# Patient Record
Sex: Female | Born: 1960 | Race: Black or African American | Hispanic: No | Marital: Single | Smoking: Never smoker
Health system: Southern US, Community
[De-identification: ages and names within clinical notes are randomized; demographics above are authoritative.]

## PROBLEM LIST (undated history)

## (undated) DIAGNOSIS — I10 Essential (primary) hypertension: Secondary | ICD-10-CM

## (undated) HISTORY — PX: BREAST BIOPSY: SHX20

---

## 2000-12-24 ENCOUNTER — Encounter: Payer: Self-pay | Admitting: Family Medicine

## 2000-12-24 ENCOUNTER — Encounter: Admission: RE | Admit: 2000-12-24 | Discharge: 2000-12-24 | Payer: Self-pay | Admitting: Family Medicine

## 2001-01-04 ENCOUNTER — Encounter: Payer: Self-pay | Admitting: Family Medicine

## 2001-01-04 ENCOUNTER — Ambulatory Visit (HOSPITAL_COMMUNITY): Admission: RE | Admit: 2001-01-04 | Discharge: 2001-01-04 | Payer: Self-pay | Admitting: Family Medicine

## 2001-12-16 ENCOUNTER — Ambulatory Visit (HOSPITAL_COMMUNITY): Admission: RE | Admit: 2001-12-16 | Discharge: 2001-12-16 | Payer: Self-pay | Admitting: Family Medicine

## 2001-12-16 ENCOUNTER — Encounter: Payer: Self-pay | Admitting: Family Medicine

## 2001-12-19 ENCOUNTER — Encounter: Admission: RE | Admit: 2001-12-19 | Discharge: 2002-02-14 | Payer: Self-pay | Admitting: Sports Medicine

## 2001-12-26 ENCOUNTER — Other Ambulatory Visit: Admission: RE | Admit: 2001-12-26 | Discharge: 2001-12-26 | Payer: Self-pay | Admitting: Family Medicine

## 2002-01-07 ENCOUNTER — Emergency Department (HOSPITAL_COMMUNITY): Admission: EM | Admit: 2002-01-07 | Discharge: 2002-01-07 | Payer: Self-pay | Admitting: Emergency Medicine

## 2002-01-14 ENCOUNTER — Ambulatory Visit (HOSPITAL_COMMUNITY): Admission: RE | Admit: 2002-01-14 | Discharge: 2002-01-14 | Payer: Self-pay | Admitting: Sports Medicine

## 2002-03-07 ENCOUNTER — Encounter: Admission: RE | Admit: 2002-03-07 | Discharge: 2002-04-13 | Payer: Self-pay | Admitting: Neurosurgery

## 2002-10-31 ENCOUNTER — Encounter: Payer: Self-pay | Admitting: Family Medicine

## 2002-10-31 ENCOUNTER — Encounter: Admission: RE | Admit: 2002-10-31 | Discharge: 2002-10-31 | Payer: Self-pay | Admitting: Family Medicine

## 2003-07-19 ENCOUNTER — Other Ambulatory Visit: Admission: RE | Admit: 2003-07-19 | Discharge: 2003-07-19 | Payer: Self-pay | Admitting: Family Medicine

## 2003-08-03 ENCOUNTER — Ambulatory Visit (HOSPITAL_COMMUNITY): Admission: RE | Admit: 2003-08-03 | Discharge: 2003-08-03 | Payer: Self-pay | Admitting: Family Medicine

## 2003-10-25 ENCOUNTER — Encounter: Admission: RE | Admit: 2003-10-25 | Discharge: 2003-10-25 | Payer: Self-pay | Admitting: Family Medicine

## 2003-12-13 ENCOUNTER — Encounter: Admission: RE | Admit: 2003-12-13 | Discharge: 2003-12-13 | Payer: Self-pay | Admitting: Family Medicine

## 2004-07-21 ENCOUNTER — Other Ambulatory Visit: Admission: RE | Admit: 2004-07-21 | Discharge: 2004-07-21 | Payer: Self-pay | Admitting: Family Medicine

## 2004-07-24 ENCOUNTER — Ambulatory Visit (HOSPITAL_COMMUNITY): Admission: RE | Admit: 2004-07-24 | Discharge: 2004-07-24 | Payer: Self-pay | Admitting: Family Medicine

## 2004-11-18 ENCOUNTER — Encounter: Admission: RE | Admit: 2004-11-18 | Discharge: 2004-11-18 | Payer: Self-pay | Admitting: Family Medicine

## 2005-08-31 ENCOUNTER — Other Ambulatory Visit: Admission: RE | Admit: 2005-08-31 | Discharge: 2005-08-31 | Payer: Self-pay | Admitting: Family Medicine

## 2005-11-26 ENCOUNTER — Encounter: Admission: RE | Admit: 2005-11-26 | Discharge: 2005-11-26 | Payer: Self-pay | Admitting: Family Medicine

## 2006-03-29 ENCOUNTER — Encounter: Admission: RE | Admit: 2006-03-29 | Discharge: 2006-03-29 | Payer: Self-pay | Admitting: Family Medicine

## 2006-09-08 ENCOUNTER — Other Ambulatory Visit: Admission: RE | Admit: 2006-09-08 | Discharge: 2006-09-08 | Payer: Self-pay | Admitting: Family Medicine

## 2006-12-02 ENCOUNTER — Encounter: Admission: RE | Admit: 2006-12-02 | Discharge: 2006-12-02 | Payer: Self-pay | Admitting: Family Medicine

## 2007-12-15 ENCOUNTER — Encounter: Admission: RE | Admit: 2007-12-15 | Discharge: 2007-12-15 | Payer: Self-pay | Admitting: Family Medicine

## 2007-12-22 ENCOUNTER — Other Ambulatory Visit: Admission: RE | Admit: 2007-12-22 | Discharge: 2007-12-22 | Payer: Self-pay | Admitting: Obstetrics and Gynecology

## 2008-06-13 ENCOUNTER — Other Ambulatory Visit: Admission: RE | Admit: 2008-06-13 | Discharge: 2008-06-13 | Payer: Self-pay | Admitting: Otolaryngology

## 2008-12-20 ENCOUNTER — Encounter: Admission: RE | Admit: 2008-12-20 | Discharge: 2008-12-20 | Payer: Self-pay | Admitting: Family Medicine

## 2009-07-16 ENCOUNTER — Other Ambulatory Visit: Admission: RE | Admit: 2009-07-16 | Discharge: 2009-07-16 | Payer: Self-pay | Admitting: Obstetrics and Gynecology

## 2010-01-03 ENCOUNTER — Encounter: Admission: RE | Admit: 2010-01-03 | Discharge: 2010-01-03 | Payer: Self-pay | Admitting: Family Medicine

## 2010-12-08 ENCOUNTER — Other Ambulatory Visit: Payer: Self-pay | Admitting: Family Medicine

## 2010-12-09 ENCOUNTER — Other Ambulatory Visit: Payer: Self-pay | Admitting: Family Medicine

## 2010-12-09 DIAGNOSIS — Z1231 Encounter for screening mammogram for malignant neoplasm of breast: Secondary | ICD-10-CM

## 2011-01-09 ENCOUNTER — Ambulatory Visit
Admission: RE | Admit: 2011-01-09 | Discharge: 2011-01-09 | Disposition: A | Discharge status: Home / Self Care | Payer: 59 | Source: Ambulatory Visit | Attending: Family Medicine | Admitting: Family Medicine

## 2011-01-09 DIAGNOSIS — Z1231 Encounter for screening mammogram for malignant neoplasm of breast: Secondary | ICD-10-CM

## 2011-08-25 ENCOUNTER — Other Ambulatory Visit: Payer: Self-pay | Admitting: Obstetrics and Gynecology

## 2011-08-25 ENCOUNTER — Other Ambulatory Visit (HOSPITAL_COMMUNITY)
Admission: RE | Admit: 2011-08-25 | Discharge: 2011-08-25 | Disposition: A | Discharge status: Home / Self Care | Payer: 59 | Source: Ambulatory Visit | Attending: Obstetrics and Gynecology | Admitting: Obstetrics and Gynecology

## 2011-08-25 DIAGNOSIS — Z01419 Encounter for gynecological examination (general) (routine) without abnormal findings: Secondary | ICD-10-CM | POA: Insufficient documentation

## 2011-12-14 ENCOUNTER — Other Ambulatory Visit: Payer: Self-pay | Admitting: Family Medicine

## 2011-12-14 DIAGNOSIS — Z1231 Encounter for screening mammogram for malignant neoplasm of breast: Secondary | ICD-10-CM

## 2012-01-22 ENCOUNTER — Ambulatory Visit
Admission: RE | Admit: 2012-01-22 | Discharge: 2012-01-22 | Disposition: A | Discharge status: Home / Self Care | Payer: 59 | Source: Ambulatory Visit | Attending: Family Medicine | Admitting: Family Medicine

## 2012-01-22 DIAGNOSIS — Z1231 Encounter for screening mammogram for malignant neoplasm of breast: Secondary | ICD-10-CM

## 2012-03-11 ENCOUNTER — Ambulatory Visit
Admission: RE | Admit: 2012-03-11 | Discharge: 2012-03-11 | Disposition: A | Discharge status: Home / Self Care | Payer: 59 | Source: Ambulatory Visit | Attending: Family Medicine | Admitting: Family Medicine

## 2012-03-11 ENCOUNTER — Other Ambulatory Visit: Payer: Self-pay | Admitting: Family Medicine

## 2012-03-11 DIAGNOSIS — M25559 Pain in unspecified hip: Secondary | ICD-10-CM

## 2012-03-14 ENCOUNTER — Other Ambulatory Visit: Payer: Self-pay | Admitting: Family Medicine

## 2012-03-14 DIAGNOSIS — R936 Abnormal findings on diagnostic imaging of limbs: Secondary | ICD-10-CM

## 2012-03-18 ENCOUNTER — Other Ambulatory Visit: Payer: 59

## 2012-04-13 ENCOUNTER — Ambulatory Visit
Admission: RE | Admit: 2012-04-13 | Discharge: 2012-04-13 | Disposition: A | Discharge status: Home / Self Care | Payer: 59 | Source: Ambulatory Visit | Attending: Family Medicine | Admitting: Family Medicine

## 2012-04-13 DIAGNOSIS — R936 Abnormal findings on diagnostic imaging of limbs: Secondary | ICD-10-CM

## 2012-08-31 ENCOUNTER — Other Ambulatory Visit (HOSPITAL_COMMUNITY)
Admission: RE | Admit: 2012-08-31 | Discharge: 2012-08-31 | Disposition: A | Discharge status: Home / Self Care | Payer: 59 | Source: Ambulatory Visit | Attending: Obstetrics and Gynecology | Admitting: Obstetrics and Gynecology

## 2012-08-31 ENCOUNTER — Other Ambulatory Visit: Payer: Self-pay | Admitting: Obstetrics and Gynecology

## 2012-08-31 DIAGNOSIS — Z1151 Encounter for screening for human papillomavirus (HPV): Secondary | ICD-10-CM | POA: Insufficient documentation

## 2012-08-31 DIAGNOSIS — Z01419 Encounter for gynecological examination (general) (routine) without abnormal findings: Secondary | ICD-10-CM | POA: Insufficient documentation

## 2012-12-21 ENCOUNTER — Other Ambulatory Visit: Payer: Self-pay

## 2012-12-21 DIAGNOSIS — Z1231 Encounter for screening mammogram for malignant neoplasm of breast: Secondary | ICD-10-CM

## 2013-01-23 ENCOUNTER — Ambulatory Visit
Admission: RE | Admit: 2013-01-23 | Discharge: 2013-01-23 | Disposition: A | Discharge status: Home / Self Care | Payer: 59 | Source: Ambulatory Visit

## 2013-01-23 DIAGNOSIS — Z1231 Encounter for screening mammogram for malignant neoplasm of breast: Secondary | ICD-10-CM

## 2013-01-24 ENCOUNTER — Ambulatory Visit: Payer: 59

## 2013-09-04 ENCOUNTER — Other Ambulatory Visit: Payer: Self-pay | Admitting: Obstetrics and Gynecology

## 2013-09-04 ENCOUNTER — Other Ambulatory Visit (HOSPITAL_COMMUNITY)
Admission: RE | Admit: 2013-09-04 | Discharge: 2013-09-04 | Disposition: A | Discharge status: Home / Self Care | Payer: 59 | Source: Ambulatory Visit | Attending: Obstetrics and Gynecology | Admitting: Obstetrics and Gynecology

## 2013-09-04 DIAGNOSIS — Z01419 Encounter for gynecological examination (general) (routine) without abnormal findings: Secondary | ICD-10-CM | POA: Insufficient documentation

## 2013-09-05 LAB — CYTOLOGY - PAP

## 2013-10-27 ENCOUNTER — Encounter (HOSPITAL_COMMUNITY): Payer: Self-pay | Admitting: Emergency Medicine

## 2013-10-27 ENCOUNTER — Emergency Department (HOSPITAL_COMMUNITY): Payer: 59

## 2013-10-27 ENCOUNTER — Emergency Department (HOSPITAL_COMMUNITY)
Admission: EM | Admit: 2013-10-27 | Discharge: 2013-10-30 | Payer: 59 | Attending: Emergency Medicine | Admitting: Emergency Medicine

## 2013-10-27 DIAGNOSIS — Z008 Encounter for other general examination: Secondary | ICD-10-CM | POA: Diagnosis present

## 2013-10-27 DIAGNOSIS — F22 Delusional disorders: Secondary | ICD-10-CM | POA: Diagnosis not present

## 2013-10-27 DIAGNOSIS — R410 Disorientation, unspecified: Secondary | ICD-10-CM

## 2013-10-27 DIAGNOSIS — F29 Unspecified psychosis not due to a substance or known physiological condition: Secondary | ICD-10-CM | POA: Insufficient documentation

## 2013-10-27 DIAGNOSIS — I10 Essential (primary) hypertension: Secondary | ICD-10-CM | POA: Insufficient documentation

## 2013-10-27 DIAGNOSIS — F23 Brief psychotic disorder: Secondary | ICD-10-CM

## 2013-10-27 HISTORY — DX: Essential (primary) hypertension: I10

## 2013-10-27 LAB — RAPID URINE DRUG SCREEN, HOSP PERFORMED
Amphetamines: NOT DETECTED
BARBITURATES: NOT DETECTED
Benzodiazepines: NOT DETECTED
Cocaine: NOT DETECTED
OPIATES: NOT DETECTED
TETRAHYDROCANNABINOL: NOT DETECTED

## 2013-10-27 LAB — COMPREHENSIVE METABOLIC PANEL
ALT: 19 U/L (ref 0–35)
AST: 25 U/L (ref 0–37)
Albumin: 5 g/dL (ref 3.5–5.2)
Alkaline Phosphatase: 110 U/L (ref 39–117)
Anion gap: 23 — ABNORMAL HIGH (ref 5–15)
BILIRUBIN TOTAL: 0.7 mg/dL (ref 0.3–1.2)
BUN: 21 mg/dL (ref 6–23)
CALCIUM: 10.7 mg/dL — AB (ref 8.4–10.5)
CO2: 21 meq/L (ref 19–32)
CREATININE: 0.76 mg/dL (ref 0.50–1.10)
Chloride: 98 mEq/L (ref 96–112)
GFR calc Af Amer: >90 mL/min (ref >90)
GLUCOSE: 120 mg/dL — AB (ref 70–99)
Potassium: 3.2 mEq/L — ABNORMAL LOW (ref 3.7–5.3)
Sodium: 142 mEq/L (ref 137–147)
Total Protein: 9.2 g/dL — ABNORMAL HIGH (ref 6.0–8.3)

## 2013-10-27 LAB — CBC WITH DIFFERENTIAL/PLATELET
Basophils Absolute: 0 10*3/uL (ref 0.0–0.1)
Basophils Relative: 0 % (ref 0–1)
EOS PCT: 0 % (ref 0–5)
Eosinophils Absolute: 0 10*3/uL (ref 0.0–0.7)
HEMATOCRIT: 41.1 % (ref 36.0–46.0)
HEMOGLOBIN: 14.1 g/dL (ref 12.0–15.0)
LYMPHS ABS: 1.9 10*3/uL (ref 0.7–4.0)
Lymphocytes Relative: 25 % (ref 12–46)
MCH: 29.8 pg (ref 26.0–34.0)
MCHC: 34.3 g/dL (ref 30.0–36.0)
MCV: 86.9 fL (ref 78.0–100.0)
MONO ABS: 0.3 10*3/uL (ref 0.1–1.0)
MONOS PCT: 4 % (ref 3–12)
Neutro Abs: 5.3 10*3/uL (ref 1.7–7.7)
Neutrophils Relative %: 71 % (ref 43–77)
Platelets: 403 10*3/uL — ABNORMAL HIGH (ref 150–400)
RBC: 4.73 MIL/uL (ref 3.87–5.11)
RDW: 12.4 % (ref 11.5–15.5)
WBC: 7.4 10*3/uL (ref 4.0–10.5)

## 2013-10-27 LAB — ETHANOL: Alcohol, Ethyl (B): <11 mg/dL (ref 0–11)

## 2013-10-27 LAB — ACETAMINOPHEN LEVEL: Acetaminophen (Tylenol), Serum: <15 ug/mL (ref 10–30)

## 2013-10-27 LAB — SALICYLATE LEVEL

## 2013-10-27 MED ORDER — OLANZAPINE 10 MG PO TBDP
10.0000 mg | ORAL_TABLET | Freq: Every day | ORAL | Status: AC
  Administered 2013-10-27: 10 mg via ORAL
  Filled 2013-10-27: qty 1

## 2013-10-27 MED ORDER — AMLODIPINE BESYLATE 10 MG PO TABS
10.0000 mg | ORAL_TABLET | Freq: Every day | ORAL | Status: DC
  Administered 2013-10-27 – 2013-10-30 (×4): 10 mg via ORAL
  Filled 2013-10-27 (×4): qty 1

## 2013-10-27 MED ORDER — LISINOPRIL 20 MG PO TABS
20.0000 mg | ORAL_TABLET | Freq: Every day | ORAL | Status: DC
  Administered 2013-10-27 – 2013-10-30 (×4): 20 mg via ORAL
  Filled 2013-10-27 (×4): qty 1

## 2013-10-27 MED ORDER — ZIPRASIDONE MESYLATE 20 MG IM SOLR
10.0000 mg | Freq: Once | INTRAMUSCULAR | Status: AC
  Administered 2013-10-27: 10 mg via INTRAMUSCULAR
  Filled 2013-10-27: qty 20

## 2013-10-27 MED ORDER — LORAZEPAM 1 MG PO TABS
2.0000 mg | ORAL_TABLET | Freq: Once | ORAL | Status: AC
  Administered 2013-10-27: 2 mg via ORAL
  Filled 2013-10-27: qty 2

## 2013-10-27 MED ORDER — HYDROCHLOROTHIAZIDE 12.5 MG PO CAPS
12.5000 mg | ORAL_CAPSULE | Freq: Every day | ORAL | Status: DC
  Administered 2013-10-27 – 2013-10-30 (×4): 12.5 mg via ORAL
  Filled 2013-10-27 (×4): qty 1

## 2013-10-27 MED ORDER — LISINOPRIL-HYDROCHLOROTHIAZIDE 20-12.5 MG PO TABS: 1.0000 | ORAL_TABLET | Freq: Every day | ORAL | Status: DC

## 2013-10-27 MED ORDER — POTASSIUM CHLORIDE CRYS ER 20 MEQ PO TBCR
40.0000 meq | EXTENDED_RELEASE_TABLET | Freq: Once | ORAL | Status: AC
  Administered 2013-10-27: 40 meq via ORAL
  Filled 2013-10-27: qty 2

## 2013-10-27 NOTE — ED Notes (Signed)
1 ring and raincoat sent with Mom.  All clothes in behavioral health.

## 2013-10-27 NOTE — ED Notes (Signed)
Pt continues to respond to internal stimuli, pacing about the hallway.  PA Maryjean Morn called for medication orders.

## 2013-10-27 NOTE — BH Assessment (Signed)
Tele Assessment Note   Diana Curry is an 53 y.o. female presenting to [facility] ED due to bizarre behaviors. It has been documented that when [employer] performed a welfare check that pt was found walking around her house in the dark with her purse on her arm while her alarm was ringing. Pt is unsure of why she is at the hospital and stated "my mom and aunt called". Pt denies SI, HI and AVH at this time. Pt has been wandering the halls and appears to be responding to internal stimuli.  Pt denies any previous suicide attempts or psychiatric hospitalizations. Pt did not report any issues with her sleep or appetite. Pt appears confused and disoriented. Pt is oriented to person and place. Pt mood is euthymic and affect is blunted. Pt did not report any illicit substance or alcohol use. Pt did not report any physical, sexual or emotional abuse at this time. It is recommended that patient be re-evaluated by psychiatry in the morning.   Axis I: Psychotic Disorder NOS Axis II: No diagnosis Axis III:  Past Medical History  Diagnosis Date  . Hypertension    Axis IV: other psychosocial or environmental problems Axis V: 21-30 behavior considerably influenced by delusions or hallucinations OR serious impairment in judgment, communication OR inability to function in almost all areas  Past Medical History:  Past Medical History  Diagnosis Date  . Hypertension     History reviewed. No pertinent past surgical history.  Family History: History reviewed. No pertinent family history.  Social History:  reports that she has never smoked. She does not have any smokeless tobacco history on file. She reports that she does not drink alcohol or use illicit drugs.  Additional Social History:  Alcohol / Drug Use History of alcohol / drug use?: No history of alcohol / drug abuse  CIWA: CIWA-Ar BP: 146/101 mmHg Pulse Rate: 98 COWS:    PATIENT STRENGTHS: (choose at least two) Average or above average  intelligence Supportive family/friends  Allergies: No Known Allergies  Home Medications:  (Not in a hospital admission)  OB/GYN Status:  No LMP recorded. Patient is postmenopausal.  General Assessment Data Location of Assessment: [facility] ED Is this a Tele or Face-to-Face Assessment?: Face-to-Face Is this an Initial Assessment or a Re-assessment for this encounter?: Initial Assessment Living Arrangements: Children (Pt reported that she lives with her adopted son age 42.) Can pt return to current living arrangement?: Yes Admission Status: Voluntary Is patient capable of signing voluntary admission?: Yes Transfer from: Home Referral Source: Self/Family/Friend     [facility] Crisis Care Plan Living Arrangements: Children (Pt reported that she lives with her adopted son age 44.) Name of Psychiatrist: None reported Name of Therapist: None reported  Education Status Is patient currently in school?: No Current Grade: NA Highest grade of school patient has completed: "Grad school" Name of school: NA Contact person: NA  Risk to self with the past 6 months Suicidal Ideation: No Suicidal Intent: No Is patient at risk for suicide?: No Suicidal Plan?: No Access to Means: No What has been your use of drugs/alcohol within the last 12 months?: No alcohol or drug use reported. Previous Attempts/Gestures: No How many times?: 0 Other Self Harm Risks: No self harm risk identified at this time.  Triggers for Past Attempts: None known Intentional Self Injurious Behavior: None Family Suicide History: No Recent stressful life event(s):  (No stressful events reported.) Persecutory voices/beliefs?: No Depression: No Depression Symptoms: Despondent Substance abuse history and/or treatment for substance  abuse?: No Suicide prevention information given to non-admitted patients: Not applicable  Risk to Others within the past 6 months Homicidal Ideation: No Thoughts of Harm to Others: No Current  Homicidal Intent: No Current Homicidal Plan: No Access to Homicidal Means: No Identified Victim: NA History of harm to others?: No Assessment of Violence: None Noted Violent Behavior Description: Pt is calm and cooperative at this time. Does patient have access to weapons?: No (Pt denies having access to weapons. ) Criminal Charges Pending?: No Does patient have a court date: No  Psychosis Hallucinations:  (Pt appears to be responding to internal stimuli .) Delusions: None noted  Mental Status Report Appear/Hygiene: In scrubs Eye Contact: Good Motor Activity: Freedom of movement Speech: Soft Level of Consciousness: Quiet/awake Mood: Euthymic Affect: Blunted Anxiety Level: Moderate Thought Processes: Coherent;Relevant Judgement: Impaired Orientation: Person;Place;Situation Obsessive Compulsive Thoughts/Behaviors: None  Cognitive Functioning Concentration: Fair Memory: Unable to Assess IQ: Average Insight: Fair Impulse Control: Fair Appetite: Good Weight Loss: 0 Weight Gain: 0 Sleep: No Change Total Hours of Sleep: 8 Vegetative Symptoms: Unable to Assess  ADLScreening [facility]) Patient's cognitive ability adequate to safely complete daily activities?: Yes Patient able to express need for assistance with ADLs?: Yes Independently performs ADLs?: Yes (appropriate for developmental age)  Prior Inpatient Therapy Prior Inpatient Therapy: No  Prior Outpatient Therapy Prior Outpatient Therapy: No  ADL Screening (condition at time of admission) Patient's cognitive ability adequate to safely complete daily activities?: Yes Patient able to express need for assistance with ADLs?: Yes Independently performs ADLs?: Yes (appropriate for developmental age)       Abuse/Neglect Assessment (Assessment to be complete while patient is alone) Physical Abuse: Denies Verbal Abuse: Denies Sexual Abuse: Denies Exploitation of patient/patient's resources:  Denies Self-Neglect: Denies Values / Beliefs Cultural Requests During Hospitalization: None Spiritual Requests During Hospitalization: None   Advance Directives (For Healthcare) Does patient have an advance directive?: No Would patient like information on creating an advanced directive?: No - patient declined information    Additional Information 1:1 In Past 12 Months?: No CIRT Risk: No Elopement Risk: Yes Does patient have medical clearance?: No     Disposition:  Disposition Initial Assessment Completed for this Encounter: Yes Disposition of Patient: Other dispositions Other disposition(s): Other (Comment) (Psychiatric evaluation in the morning. )  Cheskel Silverio S 10/27/2013 10:03 PM

## 2013-10-27 NOTE — ED Provider Notes (Signed)
CSN: [id number]     Arrival date & time 10/27/13  1329 History  This chart was scribed for Lavenia Atlas, PA, working with Hurman Horn, MD found by Elon Spanner, ED Scribe. This patient was seen in room [facility] and the patient's care was started at 1:51 PM.   Chief Complaint  Patient presents with  . Psychiatric Evaluation   The history is provided by the patient. No language interpreter was used.    HPI Comments: Diana Curry is a 53 y.o. female brought in by ambulance, who presents to the Emergency Department complaining of disorientation and paranoia.  The patient's mother states the patient typically lives at home alone and today seemed "just not herself" and was acting disorientetd.  Patient's mother reports that the patient sounded oddly when she spoke with her 2 days ago on the phone and has not been able to reach her by phone since.  Patient's mother denies previous episode of similar behavior.  Patient reports there is a computer on the vehicle that she has been driving that is tracking here.  Patient reports she has eaten normally in the last 3 days.  Patient's mother states the patient has a history of HTN for which she takes medication.  Patient has a history of fibroid surgery.  Patient's mother denies history of psychiatric illness.  Patient denies drug or alcohol use.  Patient denies visual hallucinations, SI, HI, other pain, dysuria.  Past Medical History  Diagnosis Date  . Hypertension    History reviewed. No pertinent past surgical history. History reviewed. No pertinent family history. History  Substance Use Topics  . Smoking status: Never Smoker   . Smokeless tobacco: Not on file  . Alcohol Use: No   OB History   Grav Para Term Preterm Abortions TAB SAB Ect Mult Living                 Review of Systems  Genitourinary: Negative for dysuria.  Psychiatric/Behavioral: Negative for suicidal ideas and hallucinations.  All other systems reviewed and are  negative.     Allergies  Review of patient's allergies indicates no known allergies.  Home Medications   Prior to Admission medications   Not on File   BP 162/93  Pulse 100  Temp(Src) 97.8 F (36.6 C) (Oral)  Resp 16  SpO2 99% Physical Exam  Nursing note and vitals reviewed. Constitutional: She is oriented to person, place, and time. She appears well-developed and well-nourished. No distress.  HENT:  Head: Normocephalic and atraumatic.  Eyes: Conjunctivae and EOM are normal.  Neck: Neck supple. No tracheal deviation present.  Cardiovascular: Normal rate.   Pulmonary/Chest: Effort normal. No respiratory distress.  Musculoskeletal: Normal range of motion.  Neurological: She is alert and oriented to person, place, and time.  Skin: Skin is warm and dry.  Psychiatric: She has a normal mood and affect. Her behavior is normal.    ED Course  Procedures (including critical care time)  DIAGNOSTIC STUDIES: Oxygen Saturation is 99% on RA, normal by my interpretation.    COORDINATION OF CARE:  2:06 PM   Labs Review Labs Reviewed  CBC WITH DIFFERENTIAL - Abnormal; Notable for the following:    Platelets 403 (*)    All other components within normal limits  COMPREHENSIVE METABOLIC PANEL - Abnormal; Notable for the following:    Potassium 3.2 (*)    Glucose, Bld 120 (*)    Calcium 10.7 (*)    Total Protein 9.2 (*)  Anion gap 23 (*)    All other components within normal limits  SALICYLATE LEVEL - Abnormal; Notable for the following:    Salicylate Lvl <2.0 (*)    All other components within normal limits  COMPREHENSIVE METABOLIC PANEL - Abnormal; Notable for the following:    Glucose, Bld 104 (*)    BUN 25 (*)    GFR calc non Af Amer 57 (*)    GFR calc Af Amer 66 (*)    All other components within normal limits  URINE RAPID DRUG SCREEN (HOSP PERFORMED)  ETHANOL  ACETAMINOPHEN LEVEL  CBC WITH DIFFERENTIAL  OSMOLALITY  URINALYSIS, ROUTINE W REFLEX MICROSCOPIC   I-STAT CG4 LACTIC ACID, ED    Imaging Review No results found.   EKG Interpretation None      MDM   Final diagnoses:  Acute confusion  Acute psychosis      I personally performed the services described in this documentation, which was scribed in my presence. The recorded information has been reviewed and is accurate.    Emilia Beck, PA-C 11/06/13 [id number]

## 2013-10-27 NOTE — ED Notes (Signed)
Pts B/P 146/101, Notified Dr Juleen China, states give meds.

## 2013-10-27 NOTE — ED Notes (Signed)
Pt remains confused, auditory & visual hallucinations noted, responding to internal stimuli.  Denies SI, will continue to monitor for safety.

## 2013-10-27 NOTE — Progress Notes (Signed)
  CARE MANAGEMENT ED NOTE 10/27/2013  Patient:  Diana Curry, Diana Curry   Account Number:  [id number]  Date Initiated:  10/27/2013  Documentation initiated by:  Edd Arbour  Subjective/Objective Assessment:   53 yr old [facility] [employer] [facility], presents for psych evaluation.  Pt's work reported to EMS that Pt has been having bizarre behavior since February, such as "stalking other coworkers and being difficult."  [employer] reports that they were asked     Subjective/Objective Assessment Detail:   to perform a welfare check, because family has been unable to get a hold of her x 3 days.  pcp Cynthia white and states she also sees IT trainer     Action/Plan:   epic updated   Action/Plan Detail:   Anticipated DC Date:       Status Recommendation to Physician:   Result of Recommendation:    Other ED Services  Consult Working Psychologist, educational  Other  Outpatient Services - Pt will follow up  PCP issues    Choice offered to / List presented to:            Status of service:  Completed, signed off  ED Comments:   ED Comments Detail:

## 2013-10-27 NOTE — ED Notes (Signed)
Pt changed and wanded by security.  

## 2013-10-27 NOTE — ED Notes (Signed)
Pt continues to wander in hallway, responding to internal stimuli.

## 2013-10-27 NOTE — ED Notes (Addendum)
Per EMS, Pt, from home, presents for psych evaluation.  Pt's work reported to EMS that Pt has been having bizarre behavior since February, such as "stalking other coworkers and being difficult."  [employer] reports that they were asked to perform a welfare check, because family has been unable to get ahold of her x 3 days.  Pt was found walking around her house in the dark w/ her purse on her arm and the alarm ringing.  Per [employer], Pt seemed "vacant and seemed oblivious to the alarm."  En route A & Ox4.  Pt kept repeating "there was an accident at work and Paediatric nurse knows."  Per EMS, [employer], Pt's work, and Pt's family, there was no accident and no one knows who "Alice" is.      When asked about being SI or HI, Pt responded "I've been going around and around, because of that computer.  I've been dizzy, because I keep going around and around."  Her response to being asked about hallucinations was "there might be."  Pt's mother reports that there have been several changes at the Pt's workplace that she think has effected her mentally.

## 2013-10-28 LAB — URINALYSIS, ROUTINE W REFLEX MICROSCOPIC
Bilirubin Urine: NEGATIVE
Glucose, UA: NEGATIVE mg/dL
Hgb urine dipstick: NEGATIVE
Ketones, ur: NEGATIVE mg/dL
Leukocytes, UA: NEGATIVE
Nitrite: NEGATIVE
Protein, ur: NEGATIVE mg/dL
Specific Gravity, Urine: 1.01 (ref 1.005–1.030)
Urobilinogen, UA: 0.2 mg/dL (ref 0.0–1.0)
pH: 6 (ref 5.0–8.0)

## 2013-10-28 LAB — CBC WITH DIFFERENTIAL/PLATELET
Basophils Absolute: 0 10*3/uL (ref 0.0–0.1)
Basophils Relative: 0 % (ref 0–1)
Eosinophils Absolute: 0 10*3/uL (ref 0.0–0.7)
Eosinophils Relative: 0 % (ref 0–5)
HCT: 38 % (ref 36.0–46.0)
Hemoglobin: 12.9 g/dL (ref 12.0–15.0)
Lymphocytes Relative: 20 % (ref 12–46)
Lymphs Abs: 1.7 10*3/uL (ref 0.7–4.0)
MCH: 30 pg (ref 26.0–34.0)
MCHC: 33.9 g/dL (ref 30.0–36.0)
MCV: 88.4 fL (ref 78.0–100.0)
Monocytes Absolute: 0.6 10*3/uL (ref 0.1–1.0)
Monocytes Relative: 7 % (ref 3–12)
Neutro Abs: 6 10*3/uL (ref 1.7–7.7)
Neutrophils Relative %: 73 % (ref 43–77)
Platelets: 305 10*3/uL (ref 150–400)
RBC: 4.3 MIL/uL (ref 3.87–5.11)
RDW: 12.8 % (ref 11.5–15.5)
WBC: 8.3 10*3/uL (ref 4.0–10.5)

## 2013-10-28 LAB — COMPREHENSIVE METABOLIC PANEL
ALT: 19 U/L (ref 0–35)
AST: 32 U/L (ref 0–37)
Albumin: 3.8 g/dL (ref 3.5–5.2)
Alkaline Phosphatase: 79 U/L (ref 39–117)
Anion gap: 14 (ref 5–15)
BUN: 25 mg/dL — ABNORMAL HIGH (ref 6–23)
CO2: 23 mEq/L (ref 19–32)
Calcium: 10 mg/dL (ref 8.4–10.5)
Chloride: 102 mEq/L (ref 96–112)
Creatinine, Ser: 1.1 mg/dL (ref 0.50–1.10)
GFR calc Af Amer: 66 mL/min — ABNORMAL LOW (ref >90)
GFR calc non Af Amer: 57 mL/min — ABNORMAL LOW (ref >90)
Glucose, Bld: 104 mg/dL — ABNORMAL HIGH (ref 70–99)
Potassium: 5 mEq/L (ref 3.7–5.3)
Sodium: 139 mEq/L (ref 137–147)
Total Bilirubin: 0.5 mg/dL (ref 0.3–1.2)
Total Protein: 7.5 g/dL (ref 6.0–8.3)

## 2013-10-28 LAB — I-STAT CG4 LACTIC ACID, ED: Lactic Acid, Venous: 1.5 mmol/L (ref 0.5–2.2)

## 2013-10-28 LAB — OSMOLALITY: Osmolality: 293 mOsm/kg (ref 275–300)

## 2013-10-28 MED ORDER — SODIUM CHLORIDE 0.9 % IV BOLUS (SEPSIS)
1000.0000 mL | Freq: Once | INTRAVENOUS | Status: AC
  Administered 2013-10-28: 1000 mL via INTRAVENOUS

## 2013-10-28 NOTE — ED Notes (Signed)
Report given to Velna Hatchet RN for room 17 in the main emergency room.

## 2013-10-28 NOTE — ED Provider Notes (Signed)
4:25 PM Pt has been in ED ~24 hours. Initially brought in for psych evaluation after family noticed odd behavior. Noted to be hypotensive this am and very drowsy. Persisted through out day today and moved back to main ED for further eval. Initial labs with anion gap of 23. Normal bicarb. Minimal hypokalemia, otherwise w/u fairly unremarkbale. Received geodon, zyprexa and ativan around midnight. Lisinopril, hctz, and norvasc for HTN.  On my exam pt is laying in bed with eyes open. Flat affect. Staring blankly. Will answer my questions but not making eye contact. Voice monotone. Alert to self, place and month/year. Cannot tell me why she is in ED though or what has transpired over past few days. She denies any complaints. Denies any pain. No respiratory complaints. Seems unconcerned/unaware of situation.   Heart RRR. Lungs clear. No increased WOB. Abdomen soft, NT, ND. CN 2-12 intact. Strength 5/5 b/l u/l extremities. Normal muscle tone. No inducible clonus. Biceps/patellar reflexes 1+ b/l. Skin warm, dry. No concerning lesions noted.   Aside from mental status, exam is pretty unremarkable.   IV established. Will give bolus of NS. Check rectal temp. Repeat labs and with addition of lactic acid and serum osm. EKG.  She does not appear distressed. Possibly psychiatric illness and continued effects of meds given last night. Little/no PO intake since has been in ED. May be some element of dehydration. Initially presented hypertensive. Has hx of same. Had BP meds last night. Possibly non compliant normally? Needs further evaluation though to assess for possible medical explanation.     GFR decreased. BP improved with IVF.  Additional w/u unremarkable. I feel appropriate to go back to Psych ED.   Raeford Razor, MD 10/28/13 [phone]

## 2013-10-28 NOTE — ED Notes (Signed)
Bed: WA17 Expected date:  Expected time:  Means of arrival:  Comments: HOld for 39

## 2013-10-28 NOTE — Progress Notes (Signed)
10:50am. CSW attempted to call pt's mother, Marisol Giambra [phone]), for collateral. Left message.  Mariann Laster,     ED CSW  phone: [phone]

## 2013-10-28 NOTE — ED Notes (Addendum)
Has been asleep all day. After repeating her name multiple times she aroused enough to take her morning meds for high blood pressure. Unable to participate in an assessment as she remains too groggy from shots given over night. Sleeping peacefully, respirations even and unlabored.

## 2013-10-28 NOTE — Progress Notes (Signed)
Patient ID: Diana Curry, female   DOB: 10/09/1960, 53 y.o.   MRN: [id number] Attempted to to assessment this morning with Dr Lolly Mustache but was not able to obtain much information from here.  Patient was sleepy and was not making much sense.  All efforts to get in touch with her mother failed.  Patient was taken back to the ER this afternoon for low blood pressure.  Patient will be evaluated when she can participate in the interview.  Dahlia Byes, PMHNP-BC  I have personally seen the patient and agreed with the findings and involved in the treatment plan. Kathryne Sharper, MD

## 2013-10-28 NOTE — ED Notes (Signed)
Spoke with Patty RN-ED charge nurse-about BP continuing to decrease throughout the day. Currently is 87/53. Has eaten a sandwich today but fluid intake has been very poor. Was medicated over night with Ativan, Zyprexa and Geodon. Per Alexia Freestone will bring out to room 17.

## 2013-10-29 ENCOUNTER — Emergency Department (HOSPITAL_COMMUNITY): Payer: 59

## 2013-10-29 ENCOUNTER — Encounter (HOSPITAL_COMMUNITY): Payer: Self-pay | Admitting: Registered Nurse

## 2013-10-29 DIAGNOSIS — F23 Brief psychotic disorder: Secondary | ICD-10-CM | POA: Diagnosis present

## 2013-10-29 DIAGNOSIS — F29 Unspecified psychosis not due to a substance or known physiological condition: Secondary | ICD-10-CM

## 2013-10-29 DIAGNOSIS — R410 Disorientation, unspecified: Secondary | ICD-10-CM | POA: Diagnosis present

## 2013-10-29 MED ORDER — POTASSIUM CHLORIDE CRYS ER 20 MEQ PO TBCR
40.0000 meq | EXTENDED_RELEASE_TABLET | Freq: Once | ORAL | Status: AC
  Administered 2013-10-29: 40 meq via ORAL
  Filled 2013-10-29 (×2): qty 2

## 2013-10-29 NOTE — ED Notes (Signed)
Talked to Trinna Post, SW and she sts that even though pt has bed at [facility] in [place], transport will not happen until tomorrow (Monday), d/t trying to get IVC paperwork together and coordinate transport with [facility]. Charge RN advised

## 2013-10-29 NOTE — ED Notes (Signed)
Pt ate approximately 75% of breakfast tray 

## 2013-10-29 NOTE — ED Notes (Signed)
Patient transported to CT 

## 2013-10-29 NOTE — Discharge Instructions (Signed)
Confusion Confusion is the inability to think with your usual speed or clarity. Confusion may come on quickly or slowly over time. How quickly the confusion comes on depends on the cause. Confusion can be due to any number of causes. CAUSES   Concussion, head injury, or head trauma.  Seizures.  Stroke.  Fever.  Brain tumor.  Age related decreased brain function (dementia).  Heightened emotional states like rage or terror.  Mental illness in which the person loses the ability to determine what is real and what is not (hallucinations).  Infections such as a urinary tract infection (UTI).  Toxic effects from alcohol, drugs, or prescription medicines.  Dehydration and an imbalance of salts in the body (electrolytes).  Lack of sleep.  Low blood sugar (diabetes).  Low levels of oxygen from conditions such as chronic lung disorders.  Drug interactions or other medicine side effects.  Nutritional deficiencies, especially niacin, thiamine, vitamin C, or vitamin B.  Sudden drop in body temperature (hypothermia).  Change in routine, such as when traveling or hospitalized. SIGNS AND SYMPTOMS  People often describe their thinking as cloudy or unclear when they are confused. Confusion can also include feeling disoriented. That means you are unaware of where or who you are. You may also not know what the date or time is. If confused, you may also have difficulty paying attention, remembering, and making decisions. Some people also act aggressively when they are confused.  DIAGNOSIS  The medical evaluation of confusion may include:  Blood and urine tests.  X-rays.  Brain and nervous system tests.  Analyzing your brain waves (electroencephalogram or EEG).  Magnetic resonance imaging (MRI) of your head.  Computed tomography (CT) scan of your head.  Mental status tests in which your health care provider may ask many questions. Some of these questions may seem silly or strange,  but they are a very important test to help diagnose and treat confusion. TREATMENT  An admission to the hospital may not be needed, but a person with confusion should not be left alone. Stay with a family member or friend until the confusion clears. Avoid alcohol, pain relievers, or sedative drugs until you have fully recovered. Do not drive until directed by your health care provider. HOME CARE INSTRUCTIONS  What family and friends can do:  To find out if someone is confused, ask the person to state his or her name, age, and the date. If the person is unsure or answers incorrectly, he or she is confused.  Always introduce yourself, no matter how well the person knows you.  Often remind the person of his or her location.  Place a calendar and clock near the confused person.  Help the person with his or her medicines. You may want to use a pill box, an alarm as a reminder, or give the person each dose as prescribed.  Talk about current events and plans for the day.  Try to keep the environment calm, quiet, and peaceful.  Make sure the person keeps follow-up visits with his or her health care provider. PREVENTION  Ways to prevent confusion:  Avoid alcohol.  Eat a balanced diet.  Get enough sleep.  Take medicine only as directed by your health care provider.  Do not become isolated. Spend time with other people and make plans for your days.  Keep careful watch on your blood sugar levels if you are diabetic. SEEK IMMEDIATE MEDICAL CARE IF:   You develop severe headaches, repeated vomiting, seizures, blackouts, or  slurred speech.  There is increasing confusion, weakness, numbness, restlessness, or personality changes.  You develop a loss of balance, have marked dizziness, feel uncoordinated, or fall.  You have delusions, hallucinations, or develop severe anxiety.  Your family members think you need to be rechecked. Document Released: 02/27/2004 Document Revised: 06/05/2013  Document Reviewed: 02/24/2013 [facility] Patient Information 2015 [place], [place]. This information is not intended to replace advice given to you by your health care provider. Make sure you discuss any questions you have with your health care provider.  Paranoia Paranoia is a distrust of others that is not based on a real reason for distrust. This may reach delusional levels. This means the paranoid person feels the world is against them when there is no reason to make them feel that way. People with paranoia feel as though people around them are "out to get them".  SIMILAR MENTAL ILNESSES  Depression is a feeling as though you are down all the time. It is normal in some situations where you have just lost a loved one. It is abnormal if you are having feelings of paranoia with it.  Dementia is a physical problem with the brain in which the brain no longer works properly. There are problems with daily activities of living. Alzheimer's disease is one example of this. Dementia is also caused by old age changes in the brain which come with the death of brain cells and small strokes.  Paranoidschizophrenia. People with paranoid schizophrenia and persecutory delusional disorder have delusions in which they feel people around them are plotting against them. Persecutory delusions in paranoid schizophrenia are bizarre, sometimes grandiose, and often accompanied by auditory hallucinations. This means the person is hearing voices that are not there.  Delusionaldisorder (persecutory type). Delusions experienced by individuals with delusional disorder are more believable than those experienced by paranoid schizophrenics; they are not bizarre, though still unjustified. Individuals with delusional disorder may seem offbeat or quirky rather than mentally ill, and therefore, may never seek treatment. All of these problems usually do not allow these people to interact socially in an acceptable manner. CAUSES The cause  of paranoia is often not known. It is common in people with extended abuse of:  Cocaine.  Amphetamine.  Marijuana.  Alcohol. Sometimes there is an inherited tendency. It may be associated with stress or changes in brain chemistry. DIAGNOSIS  When paranoia is present, your caregiver may:  Refer you to a specialist.  Do a physical exam.  Perform other tests on you to make sure there are not other problems causing the paranoia including:  Physical problems.  Mental problems.  Chemical problems (other than drugs). Testing may be done to determine if there is a psychiatric disability present that can be treated with medicine. TREATMENT   Paranoia that is a symptom of a psychiatric problem should be treated by professionals.  Medicines are available which can help this disorder. Antipsychotic medicine may be prescribed by your caregiver.  Sometimes psychotherapy may be useful.  Conditions such as depression or drug abuse are treated individually. If the paranoia is caused by drug abuse, a treatment facility may be helpful. Depression may be helped by antidepressants. PROGNOSIS   Paranoid people are difficult to treat because of their belief that everyone is out to get them or harm them. Because of this mistrust, they often must be talked into entering treatment by a trusted family member or friend. They may not want to take medicine as they may see this as an attempt  to poison them.  Gradual gains in the trust of a therapist or caregiver helps in a successful treatment plan.  Some people with PPD or persecutory delusional disorder function in society without treatment in limited fashion. Document Released: 01/22/2003 Document Revised: 04/13/2011 Document Reviewed: 09/27/2007 [facility] Patient Information 2015 [place], [place]. This information is not intended to replace advice given to you by your health care provider. Make sure you discuss any questions you have with your health  care provider.

## 2013-10-29 NOTE — Progress Notes (Signed)
Received phone call back from Deedra at [facility] stating that pt has been accepted by Dr. Rusty Aus pending IVC for pt's safety d/t confusion and possible inability to sign self in.  [facility] has been notified of acceptance.  Nurse to nurse report is [phone].   Tomi Bamberger Disposition MHT

## 2013-10-29 NOTE — ED Provider Notes (Signed)
4:25 PM patient resting comfortably alert. She states "people are watching all of [place]" she is oriented x3, pleasant cooperative. Ambulates without difficulty. Results for orders placed during the hospital encounter of 10/27/13  CBC WITH DIFFERENTIAL      Result Value Ref Range   WBC 7.4  4.0 - 10.5 K/uL   RBC 4.73  3.87 - 5.11 MIL/uL   Hemoglobin 14.1  12.0 - 15.0 g/dL   HCT 16.1  09.6 - 04.5 %   MCV 86.9  78.0 - 100.0 fL   MCH 29.8  26.0 - 34.0 pg   MCHC 34.3  30.0 - 36.0 g/dL   RDW 40.9  81.1 - 91.4 %   Platelets 403 (*) 150 - 400 K/uL   Neutrophils Relative % 71  43 - 77 %   Neutro Abs 5.3  1.7 - 7.7 K/uL   Lymphocytes Relative 25  12 - 46 %   Lymphs Abs 1.9  0.7 - 4.0 K/uL   Monocytes Relative 4  3 - 12 %   Monocytes Absolute 0.3  0.1 - 1.0 K/uL   Eosinophils Relative 0  0 - 5 %   Eosinophils Absolute 0.0  0.0 - 0.7 K/uL   Basophils Relative 0  0 - 1 %   Basophils Absolute 0.0  0.0 - 0.1 K/uL  COMPREHENSIVE METABOLIC PANEL      Result Value Ref Range   Sodium 142  137 - 147 mEq/L   Potassium 3.2 (*) 3.7 - 5.3 mEq/L   Chloride 98  96 - 112 mEq/L   CO2 21  19 - 32 mEq/L   Glucose, Bld 120 (*) 70 - 99 mg/dL   BUN 21  6 - 23 mg/dL   Creatinine, Ser 7.82  0.50 - 1.10 mg/dL   Calcium 95.6 (*) 8.4 - 10.5 mg/dL   Total Protein 9.2 (*) 6.0 - 8.3 g/dL   Albumin 5.0  3.5 - 5.2 g/dL   AST 25  0 - 37 U/L   ALT 19  0 - 35 U/L   Alkaline Phosphatase 110  39 - 117 U/L   Total Bilirubin 0.7  0.3 - 1.2 mg/dL   GFR calc non Af Amer >90  >90 mL/min   GFR calc Af Amer >90  >90 mL/min   Anion gap 23 (*) 5 - 15  URINE RAPID DRUG SCREEN (HOSP PERFORMED)      Result Value Ref Range   Opiates NONE DETECTED  NONE DETECTED   Cocaine NONE DETECTED  NONE DETECTED   Benzodiazepines NONE DETECTED  NONE DETECTED   Amphetamines NONE DETECTED  NONE DETECTED   Tetrahydrocannabinol NONE DETECTED  NONE DETECTED   Barbiturates NONE DETECTED  NONE DETECTED  ETHANOL      Result Value Ref Range   Alcohol, Ethyl (B) <11  0 - 11 mg/dL  ACETAMINOPHEN LEVEL      Result Value Ref Range   Acetaminophen (Tylenol), Serum <15.0  10 - 30 ug/mL  SALICYLATE LEVEL      Result Value Ref Range   Salicylate Lvl <2.0 (*) 2.8 - 20.0 mg/dL  COMPREHENSIVE METABOLIC PANEL      Result Value Ref Range   Sodium 139  137 - 147 mEq/L   Potassium 5.0  3.7 - 5.3 mEq/L   Chloride 102  96 - 112 mEq/L   CO2 23  19 - 32 mEq/L   Glucose, Bld 104 (*) 70 - 99 mg/dL   BUN 25 (*) 6 - 23  mg/dL   Creatinine, Ser 6.96  0.50 - 1.10 mg/dL   Calcium 29.5  8.4 - 28.4 mg/dL   Total Protein 7.5  6.0 - 8.3 g/dL   Albumin 3.8  3.5 - 5.2 g/dL   AST 32  0 - 37 U/L   ALT 19  0 - 35 U/L   Alkaline Phosphatase 79  39 - 117 U/L   Total Bilirubin 0.5  0.3 - 1.2 mg/dL   GFR calc non Af Amer 57 (*) >90 mL/min   GFR calc Af Amer 66 (*) >90 mL/min   Anion gap 14  5 - 15  CBC WITH DIFFERENTIAL      Result Value Ref Range   WBC 8.3  4.0 - 10.5 K/uL   RBC 4.30  3.87 - 5.11 MIL/uL   Hemoglobin 12.9  12.0 - 15.0 g/dL   HCT 13.2  44.0 - 10.2 %   MCV 88.4  78.0 - 100.0 fL   MCH 30.0  26.0 - 34.0 pg   MCHC 33.9  30.0 - 36.0 g/dL   RDW 72.5  36.6 - 44.0 %   Platelets 305  150 - 400 K/uL   Neutrophils Relative % 73  43 - 77 %   Neutro Abs 6.0  1.7 - 7.7 K/uL   Lymphocytes Relative 20  12 - 46 %   Lymphs Abs 1.7  0.7 - 4.0 K/uL   Monocytes Relative 7  3 - 12 %   Monocytes Absolute 0.6  0.1 - 1.0 K/uL   Eosinophils Relative 0  0 - 5 %   Eosinophils Absolute 0.0  0.0 - 0.7 K/uL   Basophils Relative 0  0 - 1 %   Basophils Absolute 0.0  0.0 - 0.1 K/uL  OSMOLALITY      Result Value Ref Range   Osmolality 293  275 - 300 mOsm/kg  URINALYSIS, ROUTINE W REFLEX MICROSCOPIC      Result Value Ref Range   Color, Urine YELLOW  YELLOW   APPearance CLEAR  CLEAR   Specific Gravity, Urine 1.010  1.005 - 1.030   pH 6.0  5.0 - 8.0   Glucose, UA NEGATIVE  NEGATIVE mg/dL   Hgb urine dipstick NEGATIVE  NEGATIVE   Bilirubin Urine NEGATIVE   NEGATIVE   Ketones, ur NEGATIVE  NEGATIVE mg/dL   Protein, ur NEGATIVE  NEGATIVE mg/dL   Urobilinogen, UA 0.2  0.0 - 1.0 mg/dL   Nitrite NEGATIVE  NEGATIVE   Leukocytes, UA NEGATIVE  NEGATIVE  I-STAT CG4 LACTIC ACID, ED      Result Value Ref Range   Lactic Acid, Venous 1.50  0.5 - 2.2 mmol/L   Ct Head Wo Contrast  10/29/2013   CLINICAL DATA:  New onset disorientation and paranoia.  EXAM: CT HEAD WITHOUT CONTRAST  TECHNIQUE: Contiguous axial images were obtained from the base of the skull through the vertex without intravenous contrast.  COMPARISON:  10/27/2013  FINDINGS: There is no intra or extra-axial fluid collection or mass lesion. The basilar cisterns and ventricles have a normal appearance. There is no CT evidence for acute infarction or hemorrhage. Bone windows are unremarkable.  IMPRESSION: No evidence for acute intracranial abnormality.   Electronically Signed   By: Rosalie Gums M.D.   On: 10/29/2013 14:10   Ct Head Wo Contrast  10/27/2013   CLINICAL DATA:  For psychiatric evaluation. Patient not following commands. Altered mental status.  EXAM: CT HEAD WITHOUT CONTRAST  TECHNIQUE: Contiguous axial images were obtained  from the base of the skull through the vertex without intravenous contrast.  COMPARISON:  None.  FINDINGS: Motion degrades the images.  Ventricles are normal in size and configuration. No parenchymal masses or mass effect. No evidence of an infarct. No extra-axial masses or abnormal fluid collections.  There is no evidence of intracranial hemorrhage.  Visualized sinuses and mastoid air cells are clear. No skull lesion.  IMPRESSION: Normal unenhanced CT scan the brain. Study somewhat limited by motion.   Electronically Signed   By: Amie Portland M.D.   On: 10/27/2013 16:08     Doug Sou, MD 10/29/13 2303

## 2013-10-29 NOTE — ED Notes (Signed)
Pt ambulated to BR and back to room w/o assistance. Pt has steady gait. Pt in NAD

## 2013-10-29 NOTE — ED Notes (Signed)
Pt family at bedside. Alex, SW called so that she may talk to family about pt home situation

## 2013-10-29 NOTE — Progress Notes (Signed)
Per Dr. Lolly Mustache inptx has been recommended.  The following facilities have been contacted regarding bed availability:  [facility]- per [place] at capacity [facility]- per [place] adult beds available, referral faxed Earlene Plater- per Okey Regal at capacity [facility]- per Pattricia Boss can fax, referral faxed [facility]- per Illene Bolus beds available (begins at age 53), referral faxed [facility]- per [employer] available, referral faxed [facility]- per [facility] beds available, referral faxed [facility]- per [place] a few adult beds available, referral faxed Abran Cantor- per Tammy can fax for review, referral faxed [place]- per [facility] beds available, referral faxed [facility]- per Florentina Addison can fax for review, referral faxed [facility]- per [facility] can fax, referral faxed [facility] Lan- per Dorathy Daft 1 gero bed available [facility]- per Zollie Scale at capacity [facility]- per Sena Hitch at capacity [facility]- per [place] at [facility]- per [place] [facility]- per [place] at capacity [facility]- per Roe Coombs at [facility]- per Darl Pikes at capacity [facility]- per [employer] at [employer] [facility]- per [place] at capacity   [employer] Disposition MHT

## 2013-10-29 NOTE — Consult Note (Signed)
[facility] Face-to-Face Psychiatry Consult   Reason for Consult:  Bizarre behavior Referring Physician:  EDP  Diana Curry is an 53 y.o. female. Total Time spent with patient: 45 minutes  Assessment: AXIS I:  Psychotic Disorder NOS AXIS II:  Deferred AXIS III:   Past Medical History  Diagnosis Date  . Hypertension    AXIS IV:  other psychosocial or environmental problems AXIS V:  21-30 behavior considerably influenced by delusions or hallucinations OR serious impairment in judgment, communication OR inability to function in almost all areas  Plan:  Recommend psychiatric Inpatient admission when medically cleared.  Subjective:   HPI:  Diana Curry is a 53 y.o. female patient who presented to [facility] via EMS with complaints of bizarre behavior.  Patient looking around room appears to be confused with thought blocking and psychosis.  During interview when asked questions it takes patient a few seconds to respond to question.  Patient is aware that she is in the hospital and that she was brought by "ambulance"  There has been no improvement in patient's status since yesterday.  Patient has no psychiatric history.  CT Scan will be ordered today.  Patient states that she has a good memory but still unable to answer questions.    HPI Elements:   Location:  bizarre behavior. Quality:  confusion. Severity:  psychosis, . Timing:  2 days. Review of Systems  Unable to perform ROS: acuity of condition    Past Psychiatric History: Past Medical History  Diagnosis Date  . Hypertension     reports that she has never smoked. She does not have any smokeless tobacco history on file. She reports that she does not drink alcohol or use illicit drugs. History reviewed. No pertinent family history. Family History Substance Abuse: No Family Supports: Yes, List: (Mother and Godparents ) Living Arrangements: Children (Pt reported that she lives with her adopted son age 30.) Can pt return to current  living arrangement?: Yes Abuse/Neglect [facility]) Physical Abuse: Denies Verbal Abuse: Denies Sexual Abuse: Denies Allergies:  No Known Allergies  ACT Assessment Complete:  Yes:    Educational Status    Risk to Self: Risk to self with the past 6 months Suicidal Ideation: No Suicidal Intent: No Is patient at risk for suicide?: No Suicidal Plan?: No Access to Means: No What has been your use of drugs/alcohol within the last 12 months?: No alcohol or drug use reported. Previous Attempts/Gestures: No How many times?: 0 Other Self Harm Risks: No self harm risk identified at this time.  Triggers for Past Attempts: None known Intentional Self Injurious Behavior: None Family Suicide History: No Recent stressful life event(s):  (No stressful events reported.) Persecutory voices/beliefs?: No Depression: No Depression Symptoms: Despondent Substance abuse history and/or treatment for substance abuse?: No Suicide prevention information given to non-admitted patients: Not applicable  Risk to Others: Risk to Others within the past 6 months Homicidal Ideation: No Thoughts of Harm to Others: No Current Homicidal Intent: No Current Homicidal Plan: No Access to Homicidal Means: No Identified Victim: NA History of harm to others?: No Assessment of Violence: None Noted Violent Behavior Description: Pt is calm and cooperative at this time. Does patient have access to weapons?: No (Pt denies having access to weapons. ) Criminal Charges Pending?: No Does patient have a court date: No  Abuse: Abuse/Neglect Assessment (Assessment to be complete while patient is alone) Physical Abuse: Denies Verbal Abuse: Denies Sexual Abuse: Denies Exploitation of patient/patient's resources: Denies Self-Neglect: Denies  Prior Inpatient Therapy:  Prior Inpatient Therapy Prior Inpatient Therapy: No  Prior Outpatient Therapy: Prior Outpatient Therapy Prior Outpatient Therapy: No  Additional Information: Additional  Information 1:1 In Past 12 Months?: No CIRT Risk: No Elopement Risk: Yes Does patient have medical clearance?: No                  Objective: Blood pressure 124/74, pulse 90, temperature 98 F (36.7 C), temperature source Oral, resp. rate 17, SpO2 94.00%.There is no height or weight on file to calculate BMI. Results for orders placed during the hospital encounter of 10/27/13 (from the past 72 hour(s))  URINE RAPID DRUG SCREEN (HOSP PERFORMED)     Status: None   Collection Time    10/27/13  2:38 PM      Result Value Ref Range   Opiates NONE DETECTED  NONE DETECTED   Cocaine NONE DETECTED  NONE DETECTED   Benzodiazepines NONE DETECTED  NONE DETECTED   Amphetamines NONE DETECTED  NONE DETECTED   Tetrahydrocannabinol NONE DETECTED  NONE DETECTED   Barbiturates NONE DETECTED  NONE DETECTED   Comment:            DRUG SCREEN FOR MEDICAL PURPOSES     ONLY.  IF CONFIRMATION IS NEEDED     FOR ANY PURPOSE, NOTIFY LAB     WITHIN 5 DAYS.                LOWEST DETECTABLE LIMITS     FOR URINE DRUG SCREEN     Drug Class       Cutoff (ng/mL)     Amphetamine      1000     Barbiturate      200     Benzodiazepine   245     Tricyclics       809     Opiates          300     Cocaine          300     THC              50  CBC WITH DIFFERENTIAL     Status: Abnormal   Collection Time    10/27/13  2:41 PM      Result Value Ref Range   WBC 7.4  4.0 - 10.5 K/uL   RBC 4.73  3.87 - 5.11 MIL/uL   Hemoglobin 14.1  12.0 - 15.0 g/dL   HCT 41.1  36.0 - 46.0 %   MCV 86.9  78.0 - 100.0 fL   MCH 29.8  26.0 - 34.0 pg   MCHC 34.3  30.0 - 36.0 g/dL   RDW 12.4  11.5 - 15.5 %   Platelets 403 (*) 150 - 400 K/uL   Neutrophils Relative % 71  43 - 77 %   Neutro Abs 5.3  1.7 - 7.7 K/uL   Lymphocytes Relative 25  12 - 46 %   Lymphs Abs 1.9  0.7 - 4.0 K/uL   Monocytes Relative 4  3 - 12 %   Monocytes Absolute 0.3  0.1 - 1.0 K/uL   Eosinophils Relative 0  0 - 5 %   Eosinophils Absolute 0.0  0.0 -  0.7 K/uL   Basophils Relative 0  0 - 1 %   Basophils Absolute 0.0  0.0 - 0.1 K/uL  COMPREHENSIVE METABOLIC PANEL     Status: Abnormal   Collection Time    10/27/13  2:41 PM      Result  Value Ref Range   Sodium 142  137 - 147 mEq/L   Potassium 3.2 (*) 3.7 - 5.3 mEq/L   Chloride 98  96 - 112 mEq/L   CO2 21  19 - 32 mEq/L   Glucose, Bld 120 (*) 70 - 99 mg/dL   BUN 21  6 - 23 mg/dL   Creatinine, Ser 0.76  0.50 - 1.10 mg/dL   Calcium 10.7 (*) 8.4 - 10.5 mg/dL   Total Protein 9.2 (*) 6.0 - 8.3 g/dL   Albumin 5.0  3.5 - 5.2 g/dL   AST 25  0 - 37 U/L   ALT 19  0 - 35 U/L   Alkaline Phosphatase 110  39 - 117 U/L   Total Bilirubin 0.7  0.3 - 1.2 mg/dL   GFR calc non Af Amer >90  >90 mL/min   GFR calc Af Amer >90  >90 mL/min   Comment: (NOTE)     The eGFR has been calculated using the CKD EPI equation.     This calculation has not been validated in all clinical situations.     eGFR's persistently <90 mL/min signify possible Chronic Kidney     Disease.   Anion gap 23 (*) 5 - 15  ETHANOL     Status: None   Collection Time    10/27/13  2:41 PM      Result Value Ref Range   Alcohol, Ethyl (B) <11  0 - 11 mg/dL   Comment:            LOWEST DETECTABLE LIMIT FOR     SERUM ALCOHOL IS 11 mg/dL     FOR MEDICAL PURPOSES ONLY  ACETAMINOPHEN LEVEL     Status: None   Collection Time    10/27/13  2:41 PM      Result Value Ref Range   Acetaminophen (Tylenol), Serum <15.0  10 - 30 ug/mL   Comment:            THERAPEUTIC CONCENTRATIONS VARY     SIGNIFICANTLY. A RANGE OF 10-30     ug/mL MAY BE AN EFFECTIVE     CONCENTRATION FOR MANY PATIENTS.     HOWEVER, SOME ARE BEST TREATED     AT CONCENTRATIONS OUTSIDE THIS     RANGE.     ACETAMINOPHEN CONCENTRATIONS     >150 ug/mL AT 4 HOURS AFTER     INGESTION AND >50 ug/mL AT 12     HOURS AFTER INGESTION ARE     OFTEN ASSOCIATED WITH TOXIC     REACTIONS.  SALICYLATE LEVEL     Status: Abnormal   Collection Time    10/27/13  2:41 PM      Result  Value Ref Range   Salicylate Lvl <6.3 (*) 2.8 - 20.0 mg/dL  COMPREHENSIVE METABOLIC PANEL     Status: Abnormal   Collection Time    10/28/13  4:36 PM      Result Value Ref Range   Sodium 139  137 - 147 mEq/L   Potassium 5.0  3.7 - 5.3 mEq/L   Comment: MODERATE HEMOLYSIS     HEMOLYSIS AT THIS LEVEL MAY AFFECT RESULT     DELTA CHECK NOTED   Chloride 102  96 - 112 mEq/L   CO2 23  19 - 32 mEq/L   Glucose, Bld 104 (*) 70 - 99 mg/dL   BUN 25 (*) 6 - 23 mg/dL   Creatinine, Ser 1.10  0.50 - 1.10 mg/dL   Calcium  10.0  8.4 - 10.5 mg/dL   Total Protein 7.5  6.0 - 8.3 g/dL   Albumin 3.8  3.5 - 5.2 g/dL   AST 32  0 - 37 U/L   Comment: MODERATE HEMOLYSIS     HEMOLYSIS AT THIS LEVEL MAY AFFECT RESULT   ALT 19  0 - 35 U/L   Comment: MODERATE HEMOLYSIS     HEMOLYSIS AT THIS LEVEL MAY AFFECT RESULT   Alkaline Phosphatase 79  39 - 117 U/L   Total Bilirubin 0.5  0.3 - 1.2 mg/dL   GFR calc non Af Amer 57 (*) >90 mL/min   GFR calc Af Amer 66 (*) >90 mL/min   Comment: (NOTE)     The eGFR has been calculated using the CKD EPI equation.     This calculation has not been validated in all clinical situations.     eGFR's persistently <90 mL/min signify possible Chronic Kidney     Disease.   Anion gap 14  5 - 15  CBC WITH DIFFERENTIAL     Status: None   Collection Time    10/28/13  4:36 PM      Result Value Ref Range   WBC 8.3  4.0 - 10.5 K/uL   RBC 4.30  3.87 - 5.11 MIL/uL   Hemoglobin 12.9  12.0 - 15.0 g/dL   HCT 38.0  36.0 - 46.0 %   MCV 88.4  78.0 - 100.0 fL   MCH 30.0  26.0 - 34.0 pg   MCHC 33.9  30.0 - 36.0 g/dL   RDW 12.8  11.5 - 15.5 %   Platelets 305  150 - 400 K/uL   Comment: RESULT REPEATED AND VERIFIED     DELTA CHECK NOTED   Neutrophils Relative % 73  43 - 77 %   Neutro Abs 6.0  1.7 - 7.7 K/uL   Lymphocytes Relative 20  12 - 46 %   Lymphs Abs 1.7  0.7 - 4.0 K/uL   Monocytes Relative 7  3 - 12 %   Monocytes Absolute 0.6  0.1 - 1.0 K/uL   Eosinophils Relative 0  0 - 5 %    Eosinophils Absolute 0.0  0.0 - 0.7 K/uL   Basophils Relative 0  0 - 1 %   Basophils Absolute 0.0  0.0 - 0.1 K/uL  OSMOLALITY     Status: None   Collection Time    10/28/13  4:36 PM      Result Value Ref Range   Osmolality 293  275 - 300 mOsm/kg   Comment: Performed at [facility], ED     Status: None   Collection Time    10/28/13  4:51 PM      Result Value Ref Range   Lactic Acid, Venous 1.50  0.5 - 2.2 mmol/L  URINALYSIS, ROUTINE W REFLEX MICROSCOPIC     Status: None   Collection Time    10/28/13  7:04 PM      Result Value Ref Range   Color, Urine YELLOW  YELLOW   APPearance CLEAR  CLEAR   Specific Gravity, Urine 1.010  1.005 - 1.030   pH 6.0  5.0 - 8.0   Glucose, UA NEGATIVE  NEGATIVE mg/dL   Hgb urine dipstick NEGATIVE  NEGATIVE   Bilirubin Urine NEGATIVE  NEGATIVE   Ketones, ur NEGATIVE  NEGATIVE mg/dL   Protein, ur NEGATIVE  NEGATIVE mg/dL   Urobilinogen, UA 0.2  0.0 - 1.0  mg/dL   Nitrite NEGATIVE  NEGATIVE   Leukocytes, UA NEGATIVE  NEGATIVE   Comment: MICROSCOPIC NOT DONE ON URINES WITH NEGATIVE PROTEIN, BLOOD, LEUKOCYTES, NITRITE, OR GLUCOSE <1000 mg/dL.   Labs are reviewed see values above (CMET and CBC).  Medications reviewed and no changes made.  Current Facility-Administered Medications  Medication Dose Route Frequency Provider Last Rate Last Dose  . amLODipine (NORVASC) tablet 10 mg  10 mg Oral Daily Waylan Boga, NP   10 mg at 10/29/13 0956  . lisinopril (PRINIVIL,ZESTRIL) tablet 20 mg  20 mg Oral Daily Babette Relic, MD   20 mg at 10/29/13 1950   And  . hydrochlorothiazide (MICROZIDE) capsule 12.5 mg  12.5 mg Oral Daily Babette Relic, MD   12.5 mg at 10/29/13 9326   Current Outpatient Prescriptions  Medication Sig Dispense Refill  . amLODipine (NORVASC) 10 MG tablet Take 10 mg by mouth daily.      Marland Kitchen doxycycline (VIBRA-TABS) 100 MG tablet Take 100 mg by mouth daily.       . fluocinonide ointment (LIDEX) 7.12 % Apply 1  application topically 3 (three) times a week.       Marland Kitchen lisinopril-hydrochlorothiazide (PRINZIDE,ZESTORETIC) 20-12.5 MG per tablet Take 1 tablet by mouth daily.      . pravastatin (PRAVACHOL) 40 MG tablet Take 40 mg by mouth daily.        Psychiatric Specialty Exam:     Blood pressure 124/74, pulse 90, temperature 98 F (36.7 C), temperature source Oral, resp. rate 17, SpO2 94.00%.There is no height or weight on file to calculate BMI.  General Appearance: Casual  Eye Contact::  Fair  Speech:  Clear and Coherent and Slow  Volume:  Decreased  Mood:  Anxious, Depressed and confused  Affect:  Depressed and Flat  Thought Process:  Confused, Thought blocking  Orientation:  Full (Time, Place, and Person)  Thought Content:  Confusion, thought blocking; unsure if hallucinations    Suicidal Thoughts:  No  Homicidal Thoughts:  No  Memory:  Immediate;   Poor Recent;   Poor Remote;   Poor  Judgement:  Impaired  Insight:  Lacking  Psychomotor Activity:  Decreased  Concentration:  Poor  Recall:  Poor  Fund of Knowledge:Poor  Language: Poor  Akathisia:  No  Handed:  Right  AIMS (if indicated):     Assets:  Desire for Improvement Housing Social Support  Sleep:      Musculoskeletal: Strength & Muscle Tone: within normal limits Gait & Station: Patient able to move all extremities.  Did not see patient ambulate       Patient leans: N/A  Treatment Plan Summary: Daily contact with patient to assess and evaluate symptoms and progress in treatment Medication management CT Scan Head without contrast Inpatient treatment recommended  Rankin, Shuvon, FNP-BC 10/29/2013 12:48 PM I have personally seen the patient and agreed with the findings and involved in the treatment plan. Berniece Andreas, MD

## 2013-10-29 NOTE — ED Notes (Signed)
Patient belongings placed in locker # 33. Patient resting comfortably at this time

## 2013-10-30 NOTE — Progress Notes (Signed)
[facility]  MHT advised pt has been accepted to [facility] in the AM.  Blain Pais, MHT/NS

## 2013-10-30 NOTE — ED Notes (Signed)
Report called to Nadeen Landau at [facility]. Waiting on sheriff deputy to pick up and transport to facility.

## 2013-11-08 NOTE — ED Provider Notes (Signed)
Medical screening examination/treatment/procedure(s) were performed by non-physician practitioner and as supervising physician I was immediately available for consultation/collaboration.   EKG Interpretation   Date/Time:  Saturday October 28 2013 16:35:41 EDT Ventricular Rate:  65 PR Interval:  119 QRS Duration: 71 QT Interval:  400 QTC Calculation: 416 R Axis:   75 Text Interpretation:  Sinus rhythm Borderline short PR interval Borderline  repolarization abnormality ED PHYSICIAN INTERPRETATION AVAILABLE IN [facility] Confirmed by TEST, Record ([id number]) on 10/30/2013 7:17:53 AM       Hurman HornJohn M Aurielle Slingerland, MD 11/08/13 2044

## 2014-01-03 ENCOUNTER — Other Ambulatory Visit: Payer: Self-pay

## 2014-01-03 DIAGNOSIS — Z1231 Encounter for screening mammogram for malignant neoplasm of breast: Secondary | ICD-10-CM

## 2014-01-24 ENCOUNTER — Ambulatory Visit: Payer: Self-pay

## 2014-02-28 ENCOUNTER — Ambulatory Visit
Admission: RE | Admit: 2014-02-28 | Discharge: 2014-02-28 | Disposition: A | Discharge status: Home / Self Care | Payer: 59 | Source: Ambulatory Visit

## 2014-02-28 DIAGNOSIS — Z1231 Encounter for screening mammogram for malignant neoplasm of breast: Secondary | ICD-10-CM

## 2014-09-21 ENCOUNTER — Other Ambulatory Visit (HOSPITAL_COMMUNITY)
Admission: RE | Admit: 2014-09-21 | Discharge: 2014-09-21 | Disposition: A | Discharge status: Home / Self Care | Payer: 59 | Source: Ambulatory Visit | Attending: Obstetrics and Gynecology | Admitting: Obstetrics and Gynecology

## 2014-09-21 ENCOUNTER — Other Ambulatory Visit: Payer: Self-pay | Admitting: Obstetrics and Gynecology

## 2014-09-21 DIAGNOSIS — Z01419 Encounter for gynecological examination (general) (routine) without abnormal findings: Secondary | ICD-10-CM | POA: Diagnosis present

## 2014-09-24 LAB — CYTOLOGY - PAP

## 2015-02-08 ENCOUNTER — Other Ambulatory Visit: Payer: Self-pay

## 2015-02-08 DIAGNOSIS — Z1231 Encounter for screening mammogram for malignant neoplasm of breast: Secondary | ICD-10-CM

## 2015-03-01 ENCOUNTER — Ambulatory Visit
Admission: RE | Admit: 2015-03-01 | Discharge: 2015-03-01 | Disposition: A | Discharge status: Home / Self Care | Payer: BLUE CROSS/BLUE SHIELD | Source: Ambulatory Visit

## 2015-03-01 DIAGNOSIS — Z1231 Encounter for screening mammogram for malignant neoplasm of breast: Secondary | ICD-10-CM

## 2015-09-23 ENCOUNTER — Other Ambulatory Visit (HOSPITAL_COMMUNITY)
Admission: RE | Admit: 2015-09-23 | Discharge: 2015-09-23 | Disposition: A | Discharge status: Home / Self Care | Payer: BLUE CROSS/BLUE SHIELD | Source: Ambulatory Visit | Attending: Obstetrics and Gynecology | Admitting: Obstetrics and Gynecology

## 2015-09-23 ENCOUNTER — Other Ambulatory Visit: Payer: Self-pay | Admitting: Obstetrics and Gynecology

## 2015-09-23 DIAGNOSIS — Z01419 Encounter for gynecological examination (general) (routine) without abnormal findings: Secondary | ICD-10-CM | POA: Insufficient documentation

## 2015-09-23 DIAGNOSIS — Z1151 Encounter for screening for human papillomavirus (HPV): Secondary | ICD-10-CM | POA: Diagnosis present

## 2015-09-24 LAB — CYTOLOGY - PAP

## 2015-12-08 IMAGING — CT CT HEAD W/O CM
2 series · 16 of 30 positions shown, 20 images · non-contrast
Comparison: 10/27/2013

CLINICAL DATA: New onset disorientation and paranoia.

EXAM:
CT HEAD WITHOUT CONTRAST
TECHNIQUE: Contiguous axial images were obtained from the base of the skull
through the vertex without intravenous contrast.

[Series 2: head w/o · axial · non-contrast · 0.45mm/px · z∈[-170,-50]mm · 13 of 28 slices shown, 17 images]
[im 2/28  brain]
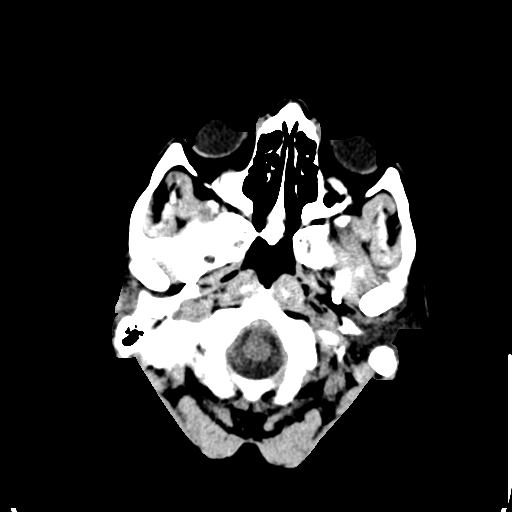
[im 2/28  bone]
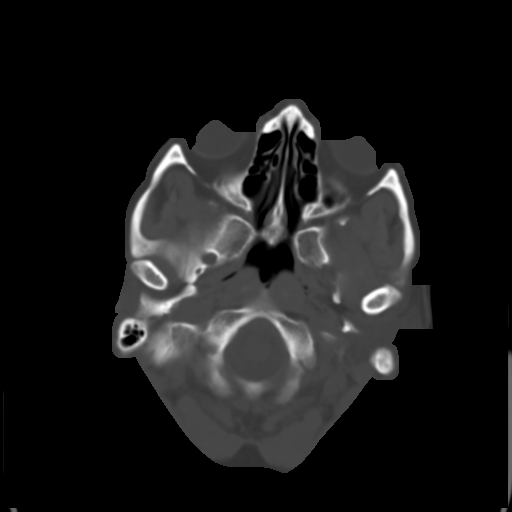
[im 4/28  brain]
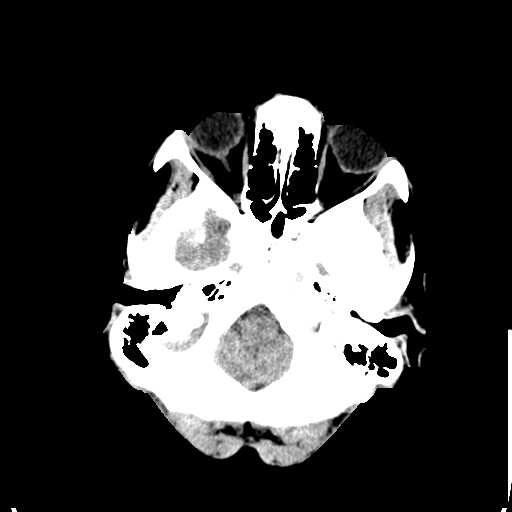
[im 6/28  brain]
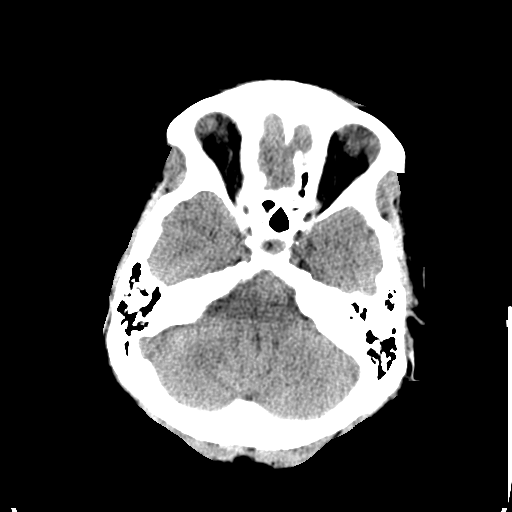
[im 8/28  brain]
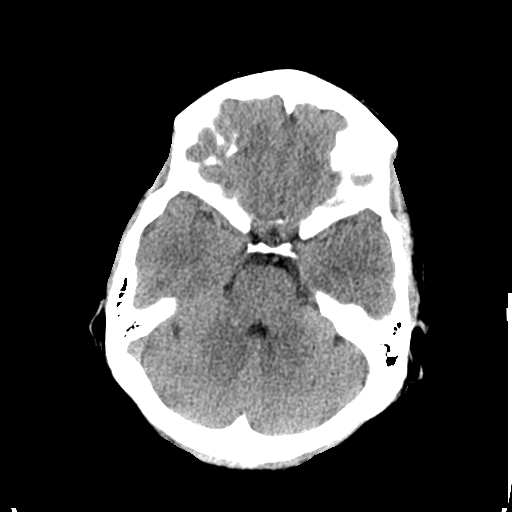
[im 10/28  brain]
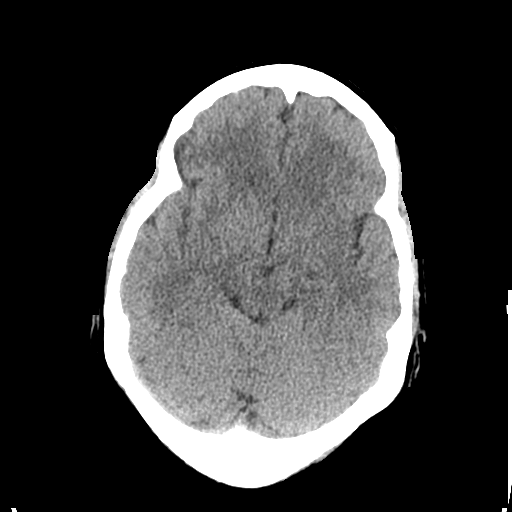
[im 10/28  bone]
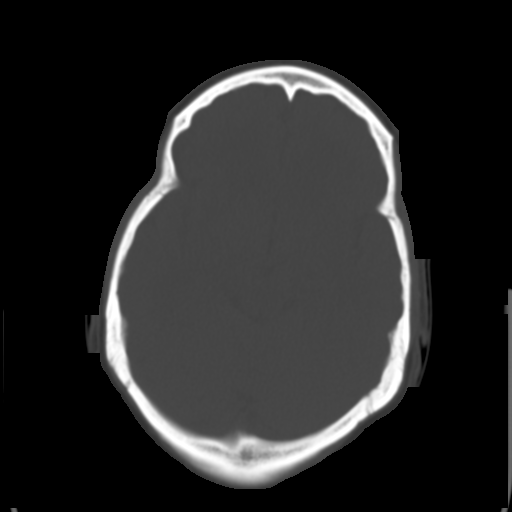
[im 12/28  brain]
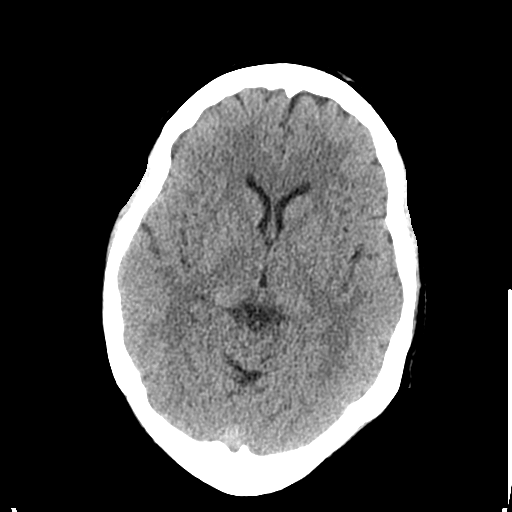
[im 14/28  brain]
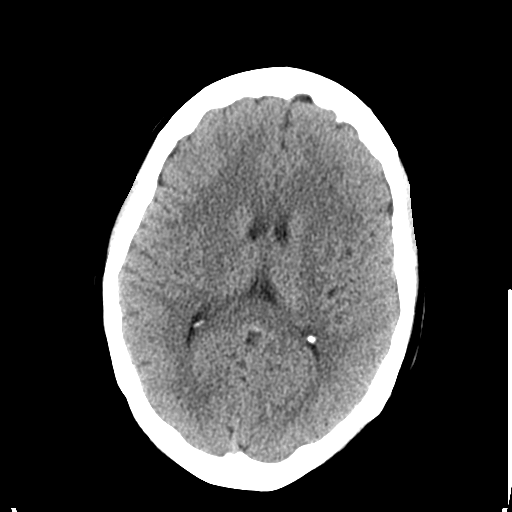
[im 16/28  brain]
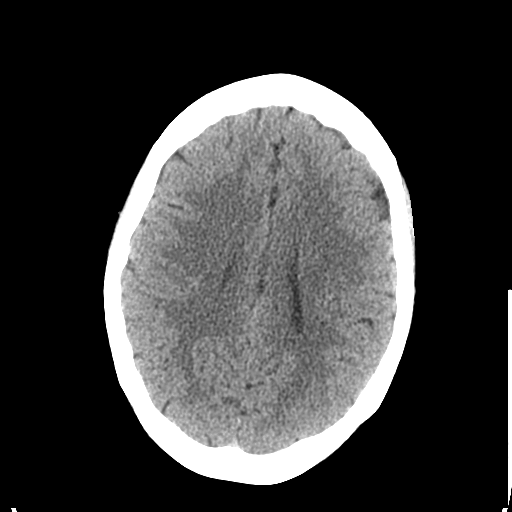
[im 18/28  brain]
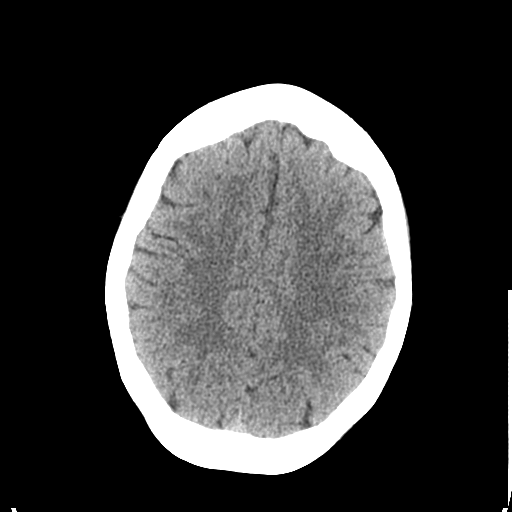
[im 18/28  bone]
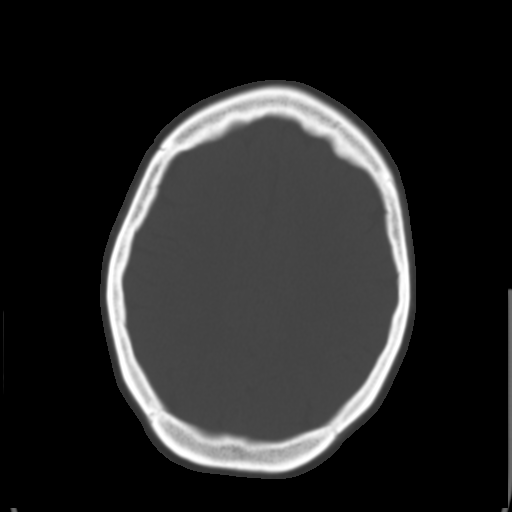
[im 20/28  brain]
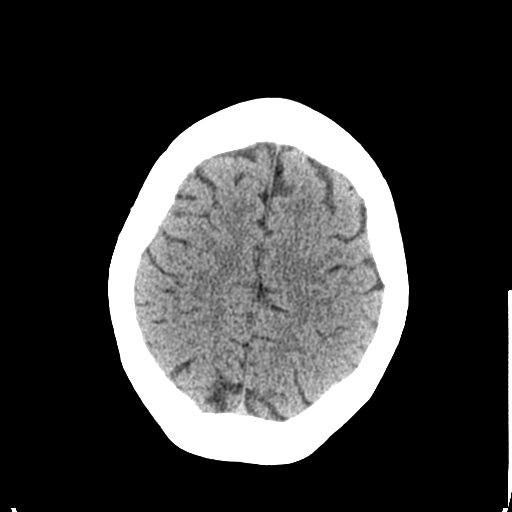
[im 22/28  brain]
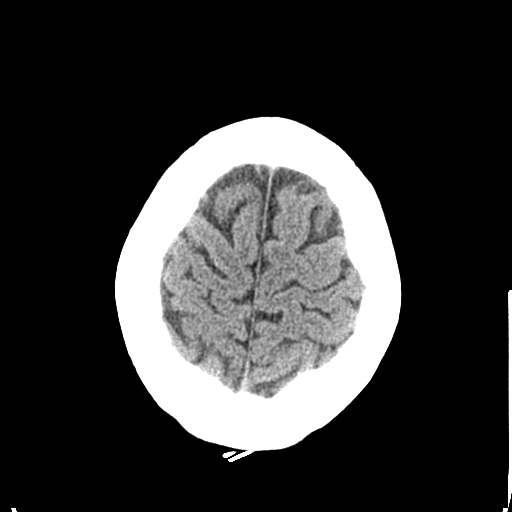
[im 24/28  brain]
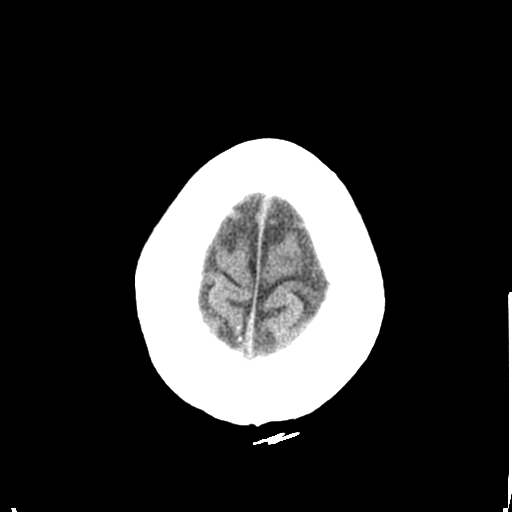
[im 26/28  brain]
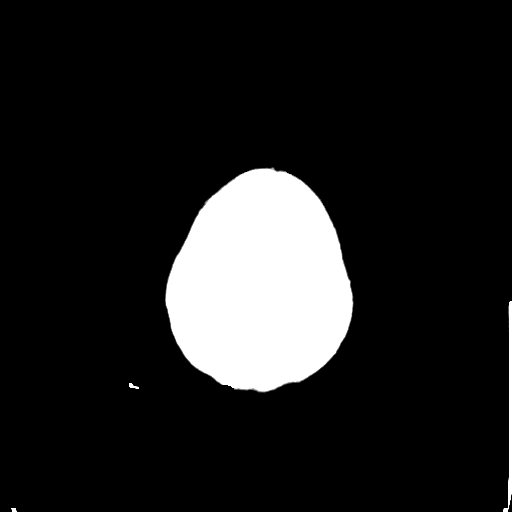
[im 26/28  bone]
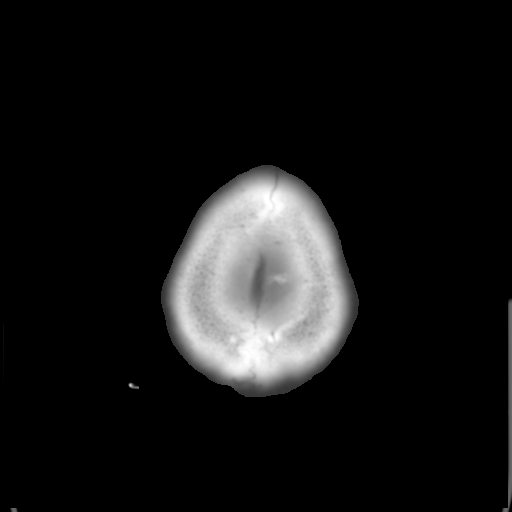

[Series 3: bone windows · axial · 0.45mm/px · z∈[-170,-130]mm · 3 of 28 slices shown]
[im 2/28  bone]
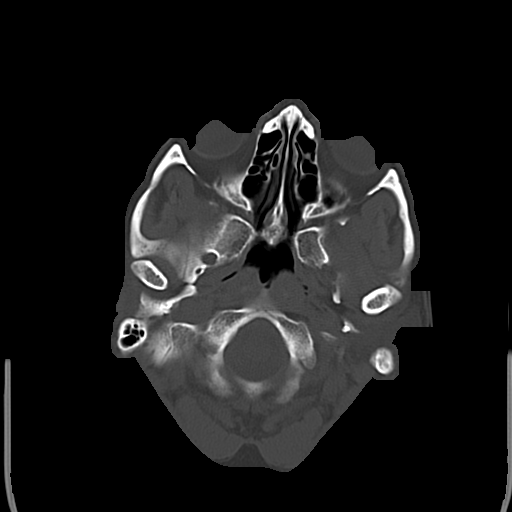
[im 6/28  bone]
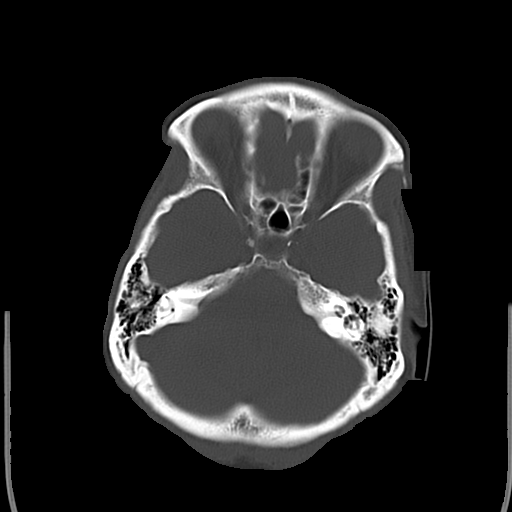
[im 10/28  bone]
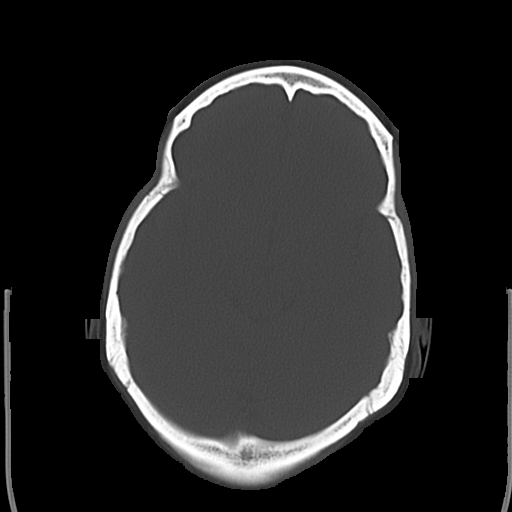

[16 of 30 positions shown; findings below may reference images not displayed]

FINDINGS: There is no intra or extra-axial fluid collection or mass lesion.
The basilar cisterns and ventricles have a normal appearance. There
is no CT evidence for acute infarction or hemorrhage. Bone windows
are unremarkable.
IMPRESSION: No evidence for acute intracranial abnormality.

## 2016-02-18 ENCOUNTER — Other Ambulatory Visit: Payer: Self-pay | Admitting: Family Medicine

## 2016-02-18 DIAGNOSIS — Z1231 Encounter for screening mammogram for malignant neoplasm of breast: Secondary | ICD-10-CM

## 2016-03-10 ENCOUNTER — Ambulatory Visit
Admission: RE | Admit: 2016-03-10 | Discharge: 2016-03-10 | Disposition: A | Discharge status: Home / Self Care | Payer: BLUE CROSS/BLUE SHIELD | Source: Ambulatory Visit | Attending: Family Medicine | Admitting: Family Medicine

## 2016-03-10 DIAGNOSIS — Z1231 Encounter for screening mammogram for malignant neoplasm of breast: Secondary | ICD-10-CM

## 2017-02-15 ENCOUNTER — Other Ambulatory Visit: Payer: Self-pay | Admitting: Family Medicine

## 2017-02-15 DIAGNOSIS — Z139 Encounter for screening, unspecified: Secondary | ICD-10-CM

## 2017-03-11 ENCOUNTER — Ambulatory Visit: Payer: Self-pay

## 2017-03-26 ENCOUNTER — Ambulatory Visit
Admission: RE | Admit: 2017-03-26 | Discharge: 2017-03-26 | Disposition: A | Discharge status: Home / Self Care | Payer: BLUE CROSS/BLUE SHIELD | Source: Ambulatory Visit | Attending: Family Medicine | Admitting: Family Medicine

## 2017-03-26 DIAGNOSIS — Z139 Encounter for screening, unspecified: Secondary | ICD-10-CM
